# Patient Record
Sex: Male | Born: 2000 | Race: Black or African American | Hispanic: No | Marital: Single | Smoking: Never smoker
Health system: Southern US, Community
[De-identification: ages and names within clinical notes are randomized; demographics above are authoritative.]

## PROBLEM LIST (undated history)

## (undated) DIAGNOSIS — Z789 Other specified health status: Secondary | ICD-10-CM

## (undated) HISTORY — DX: Other specified health status: Z78.9

---
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
## 2019-08-24 IMAGING — DX DG FINGER THUMB 2+V*L*
3 series · 3 of 3 positions shown · non-contrast
Comparison: Earlier today at 8977 hours

CLINICAL DATA: Interval reduction.

EXAM:
LEFT THUMB 2+V

[finger ap]
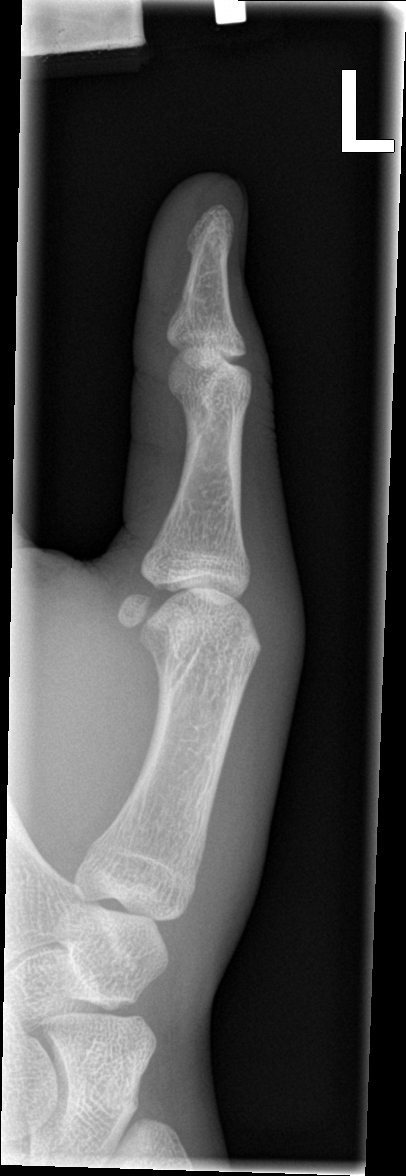

[finger obl]
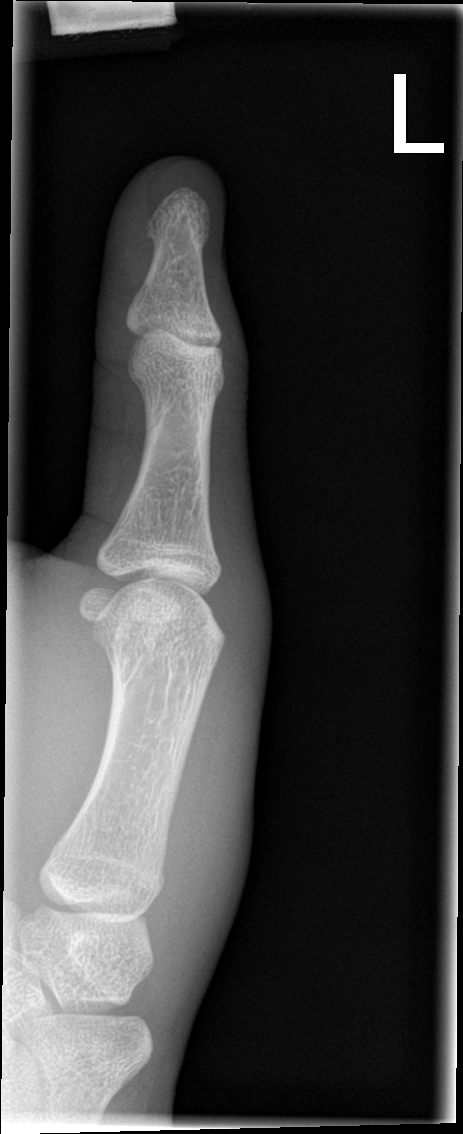

[finger lat]
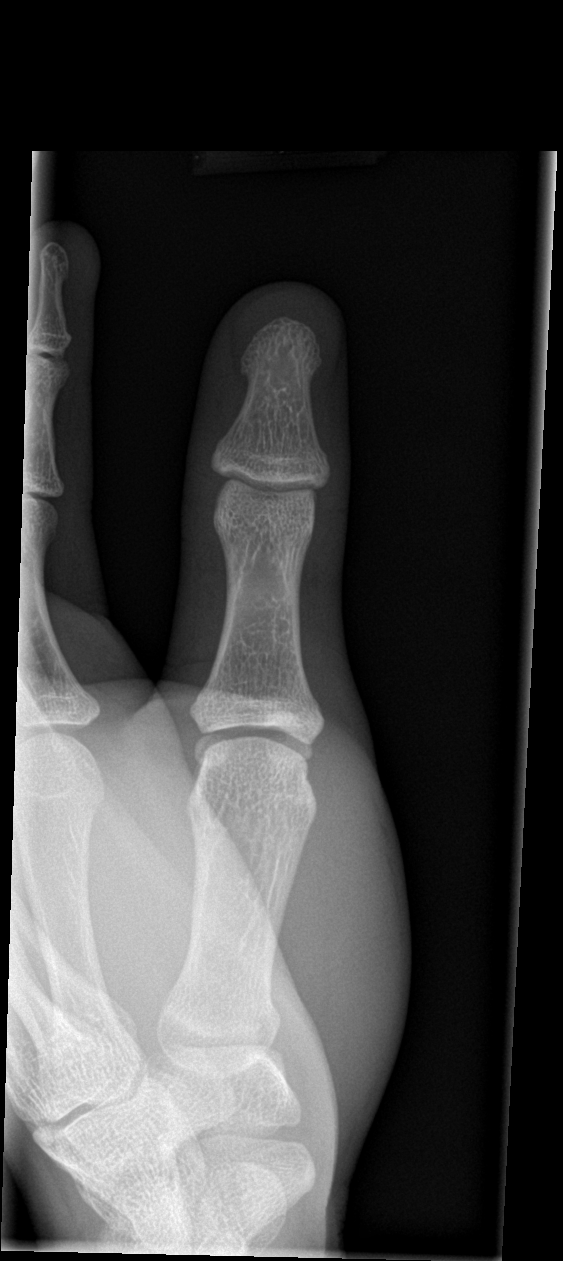

[3 of 3 positions shown; findings below may reference images not displayed]

FINDINGS: 2222 hours. Interval relocation of the first metacarpophalangeal
joint. No complicating fracture identified.
IMPRESSION: Interval relocation of the first MCP joint.

## 2021-01-23 ENCOUNTER — Ambulatory Visit (HOSPITAL_COMMUNITY)
Admission: EM | Admit: 2021-01-23 | Discharge: 2021-01-23 | Discharge status: Against Medical Advice | Payer: BLUE CROSS/BLUE SHIELD

## 2021-01-23 ENCOUNTER — Other Ambulatory Visit: Payer: Self-pay

## 2021-01-23 NOTE — ED Notes (Signed)
Per ALoree Fee, PCA -- pt stated he was not going to wait to be seen, and would return tomorrow morning.
